# Patient Record
Sex: Male | Born: 1960 | Hispanic: No | Marital: Married | Smoking: Former smoker
Health system: Southern US, Community
[De-identification: ages and names within clinical notes are randomized; demographics above are authoritative.]

---

## 2013-09-13 ENCOUNTER — Encounter (HOSPITAL_COMMUNITY): Payer: Self-pay | Admitting: Emergency Medicine

## 2013-09-13 ENCOUNTER — Emergency Department (INDEPENDENT_AMBULATORY_CARE_PROVIDER_SITE_OTHER)
Admission: EM | Admit: 2013-09-13 | Discharge: 2013-09-13 | Disposition: A | Discharge status: Nursing Home (Includes ICF) | Payer: Self-pay | Source: Home / Self Care | Attending: Family Medicine | Admitting: Family Medicine

## 2013-09-13 ENCOUNTER — Emergency Department (INDEPENDENT_AMBULATORY_CARE_PROVIDER_SITE_OTHER): Payer: Self-pay

## 2013-09-13 ENCOUNTER — Emergency Department (HOSPITAL_COMMUNITY)
Admission: EM | Admit: 2013-09-13 | Discharge: 2013-09-13 | Disposition: A | Discharge status: Home / Self Care | Payer: Self-pay | Attending: Emergency Medicine | Admitting: Emergency Medicine

## 2013-09-13 DIAGNOSIS — R509 Fever, unspecified: Secondary | ICD-10-CM

## 2013-09-13 DIAGNOSIS — Z87891 Personal history of nicotine dependence: Secondary | ICD-10-CM | POA: Insufficient documentation

## 2013-09-13 LAB — BASIC METABOLIC PANEL
BUN: 18 mg/dL (ref 6–23)
CHLORIDE: 94 meq/L — AB (ref 96–112)
CO2: 25 mEq/L (ref 19–32)
Calcium: 9.3 mg/dL (ref 8.4–10.5)
Creatinine, Ser: 0.8 mg/dL (ref 0.50–1.35)
GFR calc non Af Amer: >90 mL/min (ref >90)
Glucose, Bld: 111 mg/dL — ABNORMAL HIGH (ref 70–99)
POTASSIUM: 4.4 meq/L (ref 3.7–5.3)
Sodium: 130 mEq/L — ABNORMAL LOW (ref 137–147)

## 2013-09-13 LAB — CBC WITH DIFFERENTIAL/PLATELET
BASOS PCT: 0 % (ref 0–1)
Basophils Absolute: 0 10*3/uL (ref 0.0–0.1)
Basophils Absolute: 0 10*3/uL (ref 0.0–0.1)
Basophils Relative: 0 % (ref 0–1)
EOS ABS: 0.4 10*3/uL (ref 0.0–0.7)
EOS ABS: 0.3 10*3/uL (ref 0.0–0.7)
Eosinophils Relative: 2 % (ref 0–5)
Eosinophils Relative: 2 % (ref 0–5)
HCT: 39.4 % (ref 39.0–52.0)
HCT: 39.5 % (ref 39.0–52.0)
Hemoglobin: 12.7 g/dL — ABNORMAL LOW (ref 13.0–17.0)
Hemoglobin: 13.1 g/dL (ref 13.0–17.0)
LYMPHS ABS: 1.8 10*3/uL (ref 0.7–4.0)
LYMPHS PCT: 9 % — AB (ref 12–46)
Lymphocytes Relative: 10 % — ABNORMAL LOW (ref 12–46)
Lymphs Abs: 2.1 10*3/uL (ref 0.7–4.0)
MCH: 27.5 pg (ref 26.0–34.0)
MCH: 28 pg (ref 26.0–34.0)
MCHC: 32.2 g/dL (ref 30.0–36.0)
MCHC: 33.2 g/dL (ref 30.0–36.0)
MCV: 85.3 fL (ref 78.0–100.0)
MCV: 84.4 fL (ref 78.0–100.0)
Monocytes Absolute: 1.5 10*3/uL — ABNORMAL HIGH (ref 0.1–1.0)
Monocytes Absolute: 1.3 10*3/uL — ABNORMAL HIGH (ref 0.1–1.0)
Monocytes Relative: 7 % (ref 3–12)
Monocytes Relative: 6 % (ref 3–12)
NEUTROS PCT: 82 % — AB (ref 43–77)
Neutro Abs: 17.3 10*3/uL — ABNORMAL HIGH (ref 1.7–7.7)
Neutro Abs: 16.9 10*3/uL — ABNORMAL HIGH (ref 1.7–7.7)
Neutrophils Relative %: 82 % — ABNORMAL HIGH (ref 43–77)
PLATELETS: 257 10*3/uL (ref 150–400)
PLATELETS: 257 10*3/uL (ref 150–400)
RBC: 4.62 MIL/uL (ref 4.22–5.81)
RBC: 4.68 MIL/uL (ref 4.22–5.81)
RDW: 14.3 % (ref 11.5–15.5)
RDW: 14.4 % (ref 11.5–15.5)
WBC: 21 10*3/uL — AB (ref 4.0–10.5)
WBC: 20.6 10*3/uL — ABNORMAL HIGH (ref 4.0–10.5)

## 2013-09-13 LAB — POCT URINALYSIS DIP (DEVICE)
GLUCOSE, UA: 100 mg/dL — AB
Ketones, ur: NEGATIVE mg/dL
Leukocytes, UA: NEGATIVE
NITRITE: NEGATIVE
Protein, ur: 100 mg/dL — AB
Specific Gravity, Urine: 1.015 (ref 1.005–1.030)
Urobilinogen, UA: >=8 mg/dL (ref 0.0–1.0)
pH: 6.5 (ref 5.0–8.0)

## 2013-09-13 LAB — URINALYSIS, ROUTINE W REFLEX MICROSCOPIC
Glucose, UA: NEGATIVE mg/dL
Hgb urine dipstick: NEGATIVE
Ketones, ur: 15 mg/dL — AB
NITRITE: NEGATIVE
PH: 6 (ref 5.0–8.0)
Protein, ur: NEGATIVE mg/dL
Specific Gravity, Urine: 1.021 (ref 1.005–1.030)
Urobilinogen, UA: 2 mg/dL — ABNORMAL HIGH (ref 0.0–1.0)

## 2013-09-13 LAB — HEPATIC FUNCTION PANEL
ALT: 18 U/L (ref 0–53)
AST: 26 U/L (ref 0–37)
Albumin: 2.8 g/dL — ABNORMAL LOW (ref 3.5–5.2)
Alkaline Phosphatase: 155 U/L — ABNORMAL HIGH (ref 39–117)
BILIRUBIN DIRECT: 0.3 mg/dL (ref 0.0–0.3)
BILIRUBIN INDIRECT: 0.6 mg/dL (ref 0.3–0.9)
TOTAL PROTEIN: 8.7 g/dL — AB (ref 6.0–8.3)
Total Bilirubin: 0.9 mg/dL (ref 0.3–1.2)

## 2013-09-13 LAB — I-STAT CG4 LACTIC ACID, ED: LACTIC ACID, VENOUS: 1.04 mmol/L (ref 0.5–2.2)

## 2013-09-13 LAB — URINE MICROSCOPIC-ADD ON

## 2013-09-13 LAB — LIPASE, BLOOD: Lipase: 21 U/L (ref 11–59)

## 2013-09-13 NOTE — ED Provider Notes (Addendum)
CSN: [id number]     Arrival date & time 09/13/13  [id number] History   First MD Initiated Contact with Patient 09/13/13 1007     Chief Complaint  Patient presents with  . Fever   (Consider location/radiation/quality/duration/timing/severity/associated sxs/prior Treatment) Patient is a 53 y.o. male presenting with fever. The history is provided by the patient.  Fever Max temp prior to arrival:  101 yest Temp source:  Oral Severity:  Moderate Onset quality:  Gradual Duration:  5 days Progression:  Waxing and waning Chronicity:  New Relieved by:  Ibuprofen Associated symptoms: cough   Associated symptoms: no chest pain, no congestion, no diarrhea, no dysuria, no nausea, no rash and no vomiting     History reviewed. No pertinent past medical history. History reviewed. No pertinent past surgical history. No family history on file. History  Substance Use Topics  . Smoking status: Not on file  . Smokeless tobacco: Not on file  . Alcohol Use: No    Review of Systems  Constitutional: Positive for fever.  HENT: Negative for congestion.   Respiratory: Positive for cough.   Cardiovascular: Negative for chest pain.  Gastrointestinal: Negative for nausea, vomiting and diarrhea.  Genitourinary: Negative for dysuria, urgency, frequency and hematuria.  Skin: Negative for rash.    Allergies  Review of patient's allergies indicates no known allergies.  Home Medications   Prior to Admission medications   Not on File   BP 148/91  Pulse 92  Temp(Src) 99.1 F (37.3 C) (Oral)  Resp 18  SpO2 98% Physical Exam  Nursing note and vitals reviewed. Constitutional: He is oriented to person, place, and time. He appears well-developed and well-nourished.  HENT:  Right Ear: External ear normal.  Left Ear: External ear normal.  Mouth/Throat: Oropharynx is clear and moist.  Eyes: Conjunctivae are normal. Pupils are equal, round, and reactive to light.  Neck: Normal range of motion. Neck supple.   Cardiovascular: Normal heart sounds and intact distal pulses.   Pulmonary/Chest: Effort normal and breath sounds normal.  Abdominal: Soft. Bowel sounds are normal. He exhibits no distension and no mass. There is no tenderness. There is no rebound and no guarding.  Lymphadenopathy:    He has no cervical adenopathy.  Neurological: He is alert and oriented to person, place, and time.  Skin: Skin is warm and dry.    ED Course  Procedures (including critical care time) Labs Review Labs Reviewed  CBC WITH DIFFERENTIAL - Abnormal; Notable for the following:    WBC 21.0 (*)    Hemoglobin 12.7 (*)    Neutrophils Relative % 82 (*)    Neutro Abs 17.3 (*)    Lymphocytes Relative 9 (*)    Monocytes Absolute 1.5 (*)    All other components within normal limits  POCT URINALYSIS DIP (DEVICE) - Abnormal; Notable for the following:    Glucose, UA 100 (*)    Bilirubin Urine MODERATE (*)    Hgb urine dipstick TRACE (*)    Protein, ur 100 (*)    All other components within normal limits   Cbc--wbc 21k left shift Imaging Review Dg Chest 2 View  09/13/2013   CLINICAL DATA:  Fever  EXAM: CHEST  2 VIEW  COMPARISON:  None.  FINDINGS: The heart size and mediastinal contours are within normal limits. Both lungs are clear. The visualized skeletal structures are unremarkable.  IMPRESSION: No active cardiopulmonary disease.   Electronically Signed   By: Alcide CleverMark  Lukens M.D.   On: 09/13/2013 11:29  X-rays reviewed and report per radiologist.   MDM   1. Fever of unknown origin (FUO)    Sent to ER for further eval of fever, increased wbc, no etiol.   Linna HoffJames D Oluwaseun Cremer, MD 09/13/13 1030  Linna HoffJames D Kenya Kook, MD 09/13/13 [phone]

## 2013-09-13 NOTE — ED Notes (Signed)
Pt presents with fever and chills x1 week, states he took Tylenol with some relief but fever would returned. 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 Pt also reports decrease in appetite for several months. Pt states for approx 5 months he has experienced a slight pain st the end of urinating, pt denies penile discharge, or a slow start when urinating,  abd pain, or flank pain. Pt reports he will increase his water intake and the pain to urination subsides.

## 2013-09-13 NOTE — ED Provider Notes (Signed)
CSN: [id number]     Arrival date & time 09/13/13  1210 History   First MD Initiated Contact with Patient 09/13/13 1401     Chief Complaint  Patient presents with  . Fever     (Consider location/radiation/quality/duration/timing/severity/associated sxs/prior Treatment) HPI Carl Mills is a 53 y.o. male emergency department complaining of fever. Patient states that he has felt feverish for about a week, states that he has been taking Tylenol and Motrin with good relief. 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 Today he decided to the urgent care to get checked. Patient denies any other associated symptoms. He denies any nasal congestion, sore throat, headache, visual changes, neck pain or stiffness, chest pain, cough, abdominal pain, back pain, nausea, vomiting, diarrhea, urinary symptoms. He denies any known tick or insect exposure. He denies being outside for prolonged amounts of time. He denies any rash. He denies history of the same. He denies any recent travel or surgeries. Dental pain. He was seen by urgent care, was found to have a white blood count of 21, with a left shift, sent here for further evaluation.  History reviewed. No pertinent past medical history. History reviewed. No pertinent past surgical history. History reviewed. No pertinent family history. History  Substance Use Topics  . Smoking status: Former Games developermoker  . Smokeless tobacco: Not on file  . Alcohol Use: No    Review of Systems  Constitutional: Positive for fever. Negative for chills and fatigue.  HENT: Negative for congestion, ear pain, sore throat and trouble swallowing.   Respiratory: Negative for cough, chest tightness and shortness of breath.   Cardiovascular: Negative for chest pain, palpitations and leg swelling.  Gastrointestinal: Negative for nausea, vomiting,  abdominal pain, diarrhea and abdominal distention.  Genitourinary: Negative for dysuria, urgency, frequency and hematuria.  Musculoskeletal: Negative for arthralgias, myalgias, neck pain and neck stiffness.  Skin: Negative for rash.  Allergic/Immunologic: Negative for immunocompromised state.  Neurological: Negative for dizziness, weakness, light-headedness, numbness and headaches.      Allergies  Review of patient's allergies indicates no known allergies.  Home Medications   Prior to Admission medications   Medication Sig Start Date End Date Taking? Authorizing Provider  acetaminophen (TYLENOL) 500 MG tablet Take 500 mg by mouth every 8 (eight) hours as needed for moderate pain.   Yes Historical Provider, MD  Multiple Vitamin (MULTIVITAMIN WITH MINERALS) TABS tablet Take 1 tablet by mouth daily. [employer] mega men   Yes Historical Provider, MD   BP 139/84  Pulse 94  Temp(Src) 99.1 F (37.3 C) (Oral)  Resp 18  Ht 5\' 9"  (1.753 m)  Wt 200 lb (90.719 kg)  BMI 29.52 kg/m2  SpO2 99% Physical Exam  Nursing note and vitals reviewed. Constitutional: He is oriented to person, place, and time. He appears well-developed and well-nourished. No distress.  HENT:  Head: Normocephalic and atraumatic.  Right Ear: External ear normal.  Left Ear: External ear normal.  Nose: Nose normal.  Mouth/Throat: Oropharynx is clear and moist.  Uvula midline  Eyes: Conjunctivae are normal.  Neck: Normal range of motion. Neck supple.  No meningismus  Cardiovascular: Normal rate, regular rhythm and normal heart sounds.   Pulmonary/Chest: Effort normal. No respiratory distress. He has no wheezes. He has no rales.  Abdominal: Soft. Bowel sounds are normal. He exhibits  no distension. There is no tenderness. There is no rebound.  Musculoskeletal: He exhibits no edema.  Neurological: He is alert and oriented to person, place, and time.  Skin: Skin is warm and dry.    ED Course  Procedures (including critical  care time) Labs Review Labs Reviewed  BASIC METABOLIC PANEL - Abnormal; Notable for the following:    Sodium 130 (*)    Chloride 94 (*)    Glucose, Bld 111 (*)    All other components within normal limits  URINALYSIS, ROUTINE W REFLEX MICROSCOPIC - Abnormal; Notable for the following:    Color, Urine AMBER (*)    Bilirubin Urine SMALL (*)    Ketones, ur 15 (*)    Urobilinogen, UA 2.0 (*)    Leukocytes, UA SMALL (*)    All other components within normal limits  CBC WITH DIFFERENTIAL - Abnormal; Notable for the following:    WBC 20.6 (*)    Neutrophils Relative % 82 (*)    Neutro Abs 16.9 (*)    Lymphocytes Relative 10 (*)    Monocytes Absolute 1.3 (*)    All other components within normal limits  HEPATIC FUNCTION PANEL - Abnormal; Notable for the following:    Total Protein 8.7 (*)    Albumin 2.8 (*)    Alkaline Phosphatase 155 (*)    All other components within normal limits  CULTURE, BLOOD (ROUTINE X 2)  CULTURE, BLOOD (ROUTINE X 2)  URINE CULTURE  URINE MICROSCOPIC-ADD ON  LIPASE, BLOOD  ROCKY MTN SPOTTED FVR AB, IGG-BLOOD  ROCKY MTN SPOTTED FVR AB, IGM-BLOOD  LYME DISEASE DNA BY PCR(BORRELIA BURG)  EHRLICHIA ANTIBODY PANEL  I-STAT CG4 LACTIC ACID, ED    Imaging Review Dg Chest 2 View  09/13/2013   CLINICAL DATA:  Fever  EXAM: CHEST  2 VIEW  COMPARISON:  None.  FINDINGS: The heart size and mediastinal contours are within normal limits. Both lungs are clear. The visualized skeletal structures are unremarkable.  IMPRESSION: No active cardiopulmonary disease.   Electronically Signed   By: Alcide CleverMark  Lukens M.D.   On: 09/13/2013 11:29     EKG Interpretation None      MDM   Final diagnoses:  Fever    Patient emergency department with fever, no other complaints. He is afebrile here and at the urgent care. Labs show a white count of 20. There is a left shift. All other labs are grossly unremarkable. I do not have a source of his fever. He does not have any signs of  pharyngitis or peritonsillar abscess. There is no meningismus. He does not have a headache. There is no abdominal pain or tenderness. No chest pain or shortness of breath. No rash. No lesions on the skin. His sodium is slightly low, consider tickborne illnesses. [facility] fever and Lyme titers ordered. Lactic acid is normal, doubt sepsis. Blood cultures are pending. His vital signs are normal here except for mild hypertension. At this time he does not appear to be toxic or acutely and on. Vital signs reassuring. Discussed with Dr. Effie ShyWentz who agreed, will discharge patient home with close followup with primary care doctor and return precautions given. Labs are all pending and he will be contacted if they return abnormal.   Filed Vitals:   09/13/13 1600 09/13/13 1645 09/13/13 1658 09/13/13 1659  BP: 135/72 139/74 139/74   Pulse: 90 96 97   Temp:    98.9 F (37.2 C)  TempSrc:    Oral  Resp:   20 19  Height:      Weight:      SpO2: 96% 97% 100%       Myriam Jacobson Geovani Tootle, PA-C 09/13/13 1707

## 2013-09-13 NOTE — Discharge Instructions (Signed)
Your tests here in emergency department showed elevated white blood count, otherwise no major abnormalities. We did get blood tests that take several days to come back. You will be called if your tests, back upper normal. For now make sure to take Tylenol and Motrin for your fever. Drink clear fluids. Rest. Make sure to followup with her primary care physician for recheck in 2-3 days.   Fever, Adult A fever is a higher than normal body temperature. In an adult, an oral temperature around 98.6 F (37 C) is considered normal. A temperature of 100.4 F (38 C) or higher is generally considered a fever. Mild or moderate fevers generally have no long-term effects and often do not require treatment. Extreme fever (greater than or equal to 106 F or 41.1 C) can cause seizures. The sweating that may occur with repeated or prolonged fever may cause dehydration. Elderly people can develop confusion during a fever. A measured temperature can vary with:  Age.  Time of day.  Method of measurement (mouth, underarm, rectal, or ear). The fever is confirmed by taking a temperature with a thermometer. Temperatures can be taken different ways. Some methods are accurate and some are not.  An oral temperature is used most commonly. Electronic thermometers are fast and accurate.  An ear temperature will only be accurate if the thermometer is positioned as recommended by the manufacturer.  A rectal temperature is accurate and done for those adults who have a condition where an oral temperature cannot be taken.  An underarm (axillary) temperature is not accurate and not recommended. Fever is a symptom, not a disease.  CAUSES   Infections commonly cause fever.  Some noninfectious causes for fever include:  Some arthritis conditions.  Some thyroid or adrenal gland conditions.  Some immune system conditions.  Some types of cancer.  A medicine reaction.  High doses of certain street drugs such as  methamphetamine.  Dehydration.  Exposure to high outside or room temperatures.  Occasionally, the source of a fever cannot be determined. This is sometimes called a "fever of unknown origin" (FUO).  Some situations may lead to a temporary rise in body temperature that may go away on its own. Examples are:  Childbirth.  Surgery.  Intense exercise. HOME CARE INSTRUCTIONS   Take appropriate medicines for fever. Follow dosing instructions carefully. If you use acetaminophen to reduce the fever, be careful to avoid taking other medicines that also contain acetaminophen. Do not take aspirin for a fever if you are younger than age 51. There is an association with Reye's syndrome. Reye's syndrome is a rare but potentially deadly disease.  If an infection is present and antibiotics have been prescribed, take them as directed. Finish them even if you start to feel better.  Rest as needed.  Maintain an adequate fluid intake. To prevent dehydration during an illness with prolonged or recurrent fever, you may need to drink extra fluid.Drink enough fluids to keep your urine clear or pale yellow.  Sponging or bathing with room temperature water may help reduce body temperature. Do not use ice water or alcohol sponge baths.  Dress comfortably, but do not over-bundle. SEEK MEDICAL CARE IF:   You are unable to keep fluids down.  You develop vomiting or diarrhea.  You are not feeling at least partly better after 3 days.  You develop new symptoms or problems. SEEK IMMEDIATE MEDICAL CARE IF:   You have shortness of breath or trouble breathing.  You develop excessive weakness.  You  are dizzy or you faint.  You are extremely thirsty or you are making little or no urine.  You develop new pain that was not there before (such as in the head, neck, chest, back, or abdomen).  You have persistant vomiting and diarrhea for more than 1 to 2 days.  You develop a stiff neck or your eyes become  sensitive to light.  You develop a skin rash.  You have a fever or persistent symptoms for more than 2 to 3 days.  You have a fever and your symptoms suddenly get worse. MAKE SURE YOU:   Understand these instructions.  Will watch your condition.  Will get help right away if you are not doing well or get worse. Document Released: 10/22/2000 Document Revised: 07/21/2011 Document Reviewed: 02/27/2011 [facility] Patient Information 2014 [place], [place].   Emergency Department Resource Guide 1) Find a Doctor and Pay Out of Pocket Although you won't have to find out who is covered by your insurance plan, it is a good idea to ask around and get recommendations. You will then need to call the office and see if the doctor you have chosen will accept you as a new patient and what types of options they offer for patients who are self-pay. Some doctors offer discounts or will set up payment plans for their patients who do not have insurance, but you will need to ask so you aren't surprised when you get to your appointment.  2) Contact Your Local Health Department Not all health departments have doctors that can see patients for sick visits, but many do, so it is worth a call to see if yours does. If you don't know where your local health department is, you can check in your phone book. The CDC also has a tool to help you locate your state's health department, and many state websites also have listings of all of their local health departments.  3) Find a Walk-in Clinic If your illness is not likely to be very severe or complicated, you may want to try a walk in clinic. These are popping up all over the country in pharmacies, drugstores, and shopping centers. They're usually staffed by nurse practitioners or physician assistants that have been trained to treat common illnesses and complaints. They're usually fairly quick and inexpensive. However, if you have serious medical issues or chronic medical  problems, these are probably not your best option.  No Primary Care Doctor: - Call Health Connect at  [phone] - they can help you locate a primary care doctor that  accepts your insurance, provides certain services, etc. - Physician Referral Service- [phone]  Chronic Pain Problems: Organization         Address  Phone   Notes  [facility]  [phone] Patients need to be referred by their primary care doctor.   Medication Assistance: Organization         Address  Phone   Notes  [facility] Medication [facility] [address] [place] [place], [place] [address] [phone] --Must be a resident of [facility] -- Must have NO insurance coverage whatsoever (no Medicaid/ Medicare, etc.) -- The pt. MUST have a primary care doctor that directs their care regularly and follows them in the community   MedAssist  [phone]   [employer]  [phone]    Agencies that provide inexpensive medical care: Organization         Address  Phone   Notes  [facility]  [phone]   [facility]    [phone]   [facility] [address] [place], [place] [phone]   [facility] [address], [place] [phone]   Planned Parenthood    [phone]   [facility]    [phone]   [facility]  [address], [place] Phone:  [phone], Fax:  [phone] Hours of Operation:  9 am - 6 pm, M-F.  Also accepts Medicaid/Medicare and self-pay.  [facility]  [address], [place] Phone: [phone], Fax: [phone]) [phone]. Hours of Operation:  8:30 am - 5:30 pm, M-F.  Also accepts Medicaid and self-pay.  [facility] [place] [address], [place] Phone: [phone]) [phone]   [facility] [address], [place], [place] [phone], Ext. [phone]  Mondays & Thursdays: 7-9 AM.  First 15 patients are seen on a first come, first serve basis.    Medicaid-accepting [facility] Providers:  Organization         Address  Phone   Notes  [facility] [address], [place] [phone]) [phone] Also accepts self-pay patients.  [facility] [address], [place]  [phone]) [phone]   [facility] [address], [place] [phone]   [facility] [address] [place] [address], [place] [phone]) [phone]   Renaye RakersVeita Bland [address], [place]   [phone]) [phone] Only accepts [place] Access [place] patients after they have their name applied to their card.   Self-Pay (no insurance) in [facility]:  Organization         Address  Phone   Notes  Sickle Cell Patients, [facility] [address], [place] [phone]   [facility] [address], [place] [phone]   [facility] [place]  [address], [place] [phone]   [facility]/Dr. Osei-Bonsu  [address] [place] [address], [place] or [phone], [place] [phone] Phone number for both [place] and [place] locations is the same.  [facility] [address], [place] [phone]) [phone]   [facility] [address] [place] [address], [place] or [address] [phone]) [phone]   [facility] [address], [place] [phone], phone; [phone]) [phone], fax Sees patients 1st and 3rd Saturday of every month.  Must not qualify for public or private insurance (i.e. Medicaid, Medicare, [place] Health Choice, Veterans' Benefits)  Household income should be no more than 200% of the poverty level The clinic cannot treat you if you are pregnant or think you are pregnant  Sexually transmitted diseases are not treated at  the clinic.    Dental Care: Organization         Address  Phone  Notes  [facility] of [facility] [address], [place] [phone] Accepts children up to age 51 who are enrolled in [place] or [place] Health Choice; pregnant women with a Medicaid card; and children who have applied for Medicaid or [place] Health Choice, but were declined, whose parents can pay a reduced fee at time of service.  [facility] of  [facility]  [address], [place] [phone] Accepts children up to age 58 who are enrolled in Medicaid or [place]; pregnant women with a Medicaid card; and children who have applied for Medicaid or [place] Health Choice, but were declined, whose parents can pay a reduced fee at time of service.  [facility] Adult [facility] Access PROGRAM  [place] [phone] Patients are seen by appointment only. Walk-ins are not accepted. [place] will see patients 76 years of age and older. Monday - Tuesday (8am-5pm) Most Wednesdays (8:30-5pm) $30 per visit, cash only  [facility] [place] Adult Dental Access PROGRAM  [address], [facility] [phone] Patients are seen by appointment only. Walk-ins are not accepted. [place] will see patients 57 years of age and older. One Wednesday Evening (Monthly: Volunteer Based).  $30 per visit, cash only  [place]  [phone] for adults; Children under age 82, call Graduate Pediatric Dentistry at [phone]. Children aged 67-14, please call [phone] to request a pediatric application.  Dental services are provided in all areas of dental care including fillings, crowns and bridges, complete and partial dentures, implants, gum treatment, root canals, and extractions. Preventive care is also provided. Treatment is provided to both adults and children. Patients are selected via a lottery and there is often a  waiting list.   [facility] [address] [place]  [phone] [email or web address]   [facility] [address] [place], [place] [phone], Ext. [phone] Second and Fourth Thursday of each month, opens at 6:30 AM; Clinic ends at 9 AM.  Patients are seen on a first-come first-served basis, and a limited number are seen during each clinic.   [facility]  [address] Hillard Danker [place], [place] [phone]   Eligibility Requirements You must have lived in [place], [place], or [place] counties for at least the last three months.   You cannot be eligible for state or federal sponsored [employer], including [employer], [place], or [employer].   You generally cannot be eligible for healthcare insurance through your employer.    How to apply: Eligibility screenings are held every Tuesday and Wednesday afternoon from 1:00 pm until 4:00 pm. You do not need an appointment for the interview!  [facility] [address] [place], [place]   [place]  [place] Department  [place]  [phone]    Behavioral Health Resources in the Community: Intensive Outpatient Programs Organization         Address  Phone  Notes  [place]. [address] [place], [place] [phone]   [facility]patient [address] [place], [place]   ADS: Alcohol & Drug Svcs [address] [place], [place]   [place] [address],  [place], [place] or [phone]   Substance Abuse Resources Organization         Address  Phone  Notes  Alcohol and Drug Services  (607)[phone]   [place]  ([phone]   The [place]   Chinita Pester  (614) [phone]   Residential & Outpatient Substance Abuse  Program  [phone]   Psychological Services Organization         Address  Phone  Notes  [place]  [place]   [place] [address] [place] or 971-014-1942    Mobile Crisis Teams Organization         Address  Phone  Notes  [facility], Mobile Crisis Care Unit  ([phone])[phone]   [facility]otherapeutic [facility]  [address] [place], [place]   [place] Levels [address] [place] [phone]    Self-Help/Support Groups Organization         Address  Phone             Notes  [place]. of [place] - variety of support groups  [place] Call for more information  Narcotics Anonymous (NA), [facility] [address], [employer] [place]  2 meetings at this location   Special educational needs teacher         Address  Phone  Notes  ASAP Residential Treatment [place],    [place]  1-3316506608   [facility]  [address] [place] 291916, [place], [place]   [place], [place] [phone] Admissions: 8am-3pm M-F  Incentives Substance [place] [address],    [place], [place] [phone]   [facility] [address] [place], [place], [place]   [facility] [address],  [place], [place]   Insight Programs - Intensive Outpatient [address] Kristeen [address], [place], [place]   [facility] ([place].) [place].,  [place], [place] 1-9894775006 or [phone]   [facility] (RTS) [address] [place], [place] Accepts Medicaid  [facility] [place] [address],  [place] 1-854-456-0630 Substance Abuse/Addiction Treatment   [facility] Organization          Address  Phone  Notes  [facility]  [phone]   Domenic Schwab, PhD [address] Arlis Porta [place], [place]   [phone] or [phone]   [place], [place] [phone]   [facility] [address] [place], [place] [phone] Insurance/Medicaid/sponsorship through [facility] and Families [address] [place]                                    [place], [place] [phone] [place] [address], [place] [phone]    Dr. Adele Schilder  [phone]   [facility] Dept. 1) [address] [place] 2) [place] 3)  [place] [address], [place] [phone]  [phone]   [place] [phone] or [phone] (After Hours)

## 2013-09-13 NOTE — ED Notes (Signed)
Pt unable to provide an urine specimen at this time

## 2013-09-13 NOTE — ED Notes (Signed)
Pt sent from urgent care with abnormal labs-- denies any nausea/vomiting/diarrhea/ cough/ neck pain

## 2013-09-13 NOTE — ED Notes (Signed)
Reports fever for 1 week.  Denies any other symptoms.

## 2013-09-14 LAB — URINE CULTURE
Colony Count: NO GROWTH
Culture: NO GROWTH

## 2013-09-14 LAB — ROCKY MTN SPOTTED FVR AB, IGM-BLOOD: RMSF IgM: 0.34 IV (ref 0.00–0.89)

## 2013-09-14 LAB — ROCKY MTN SPOTTED FVR AB, IGG-BLOOD: RMSF IgG: 0.12 IV

## 2013-09-14 NOTE — ED Provider Notes (Signed)
Medical screening examination/treatment/procedure(s) were performed by non-physician practitioner and as supervising physician I was immediately available for consultation/collaboration.  Flint MelterElliott L Romeka Scifres, MD 09/14/13 1538

## 2013-09-15 LAB — EHRLICHIA ANTIBODY PANEL: E chaffeensis (HGE) Ab, IgG: <1:64 {titer}

## 2013-09-19 LAB — CULTURE, BLOOD (ROUTINE X 2)
Culture: NO GROWTH
Culture: NO GROWTH

## 2014-03-12 DEATH — deceased

## 2014-09-14 IMAGING — CR DG CHEST 2V
2 series · 2 of 2 positions shown · non-contrast
Comparison: None.

CLINICAL DATA: Fever

EXAM:
CHEST  2 VIEW

[view not recorded (1 of 2)]
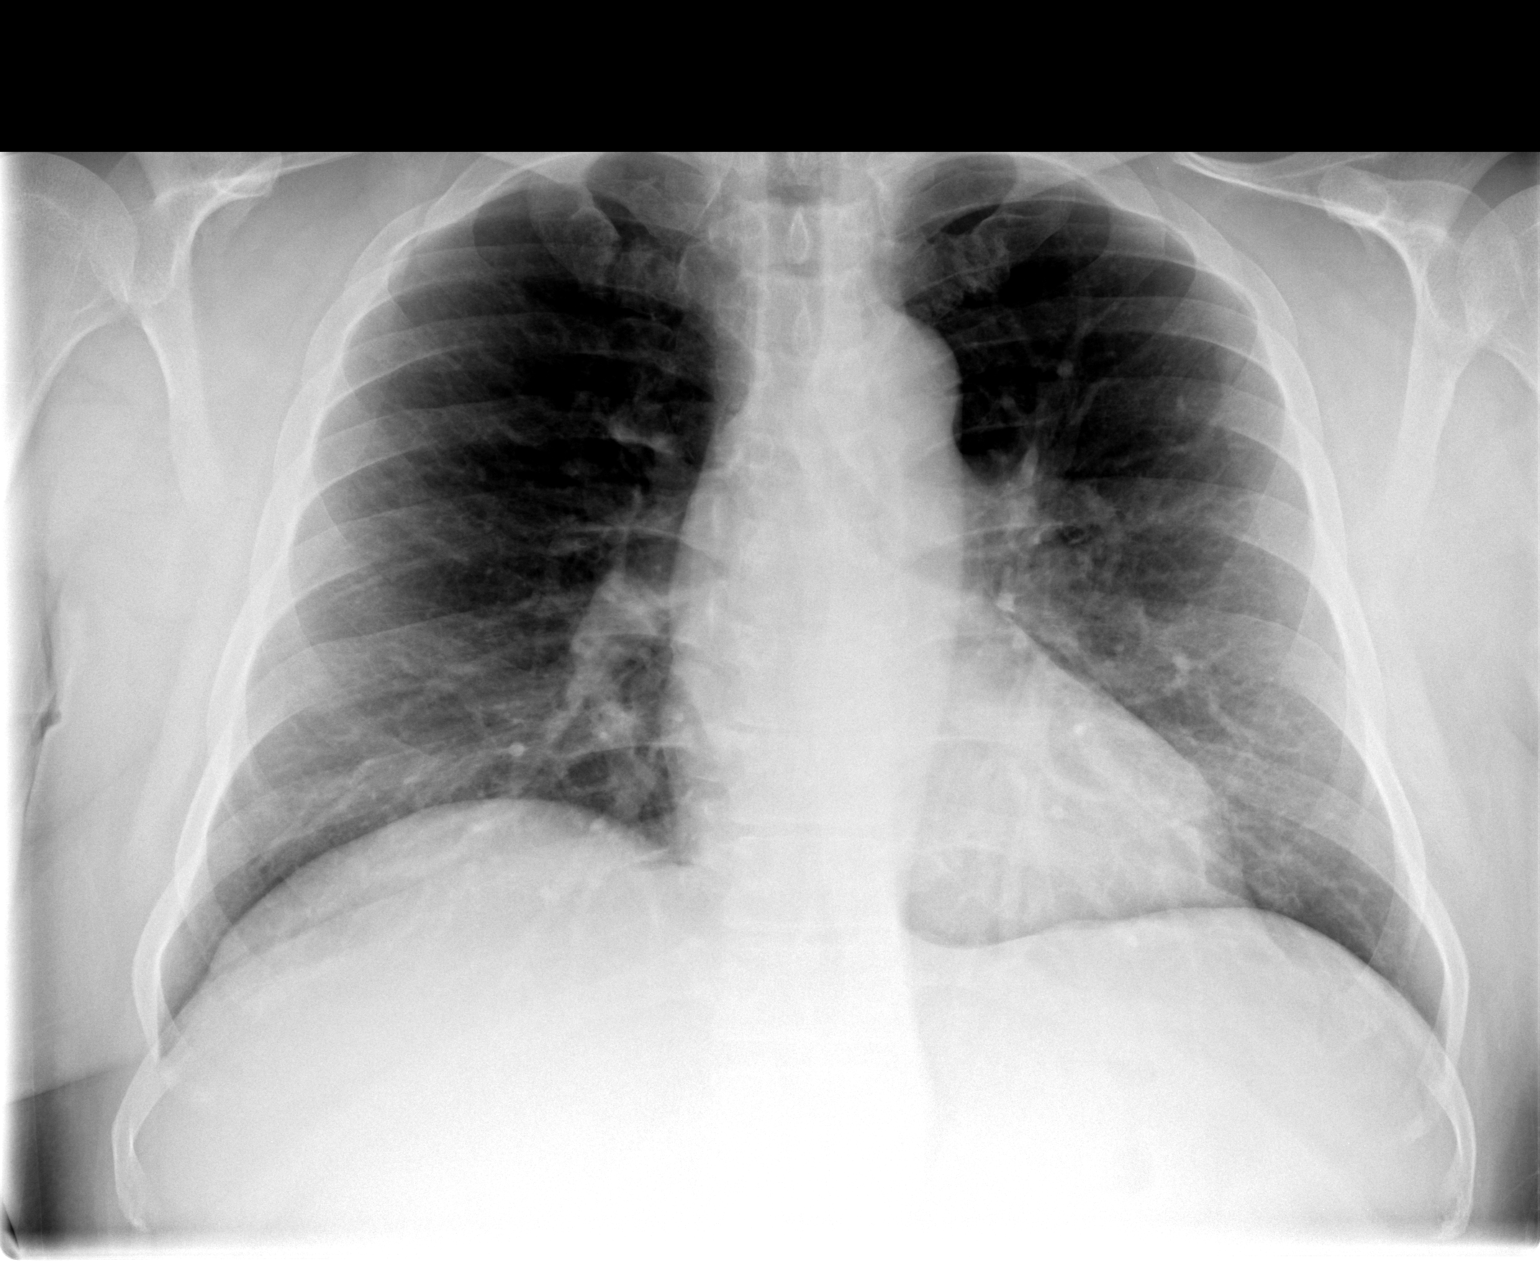

[view not recorded (2 of 2)]
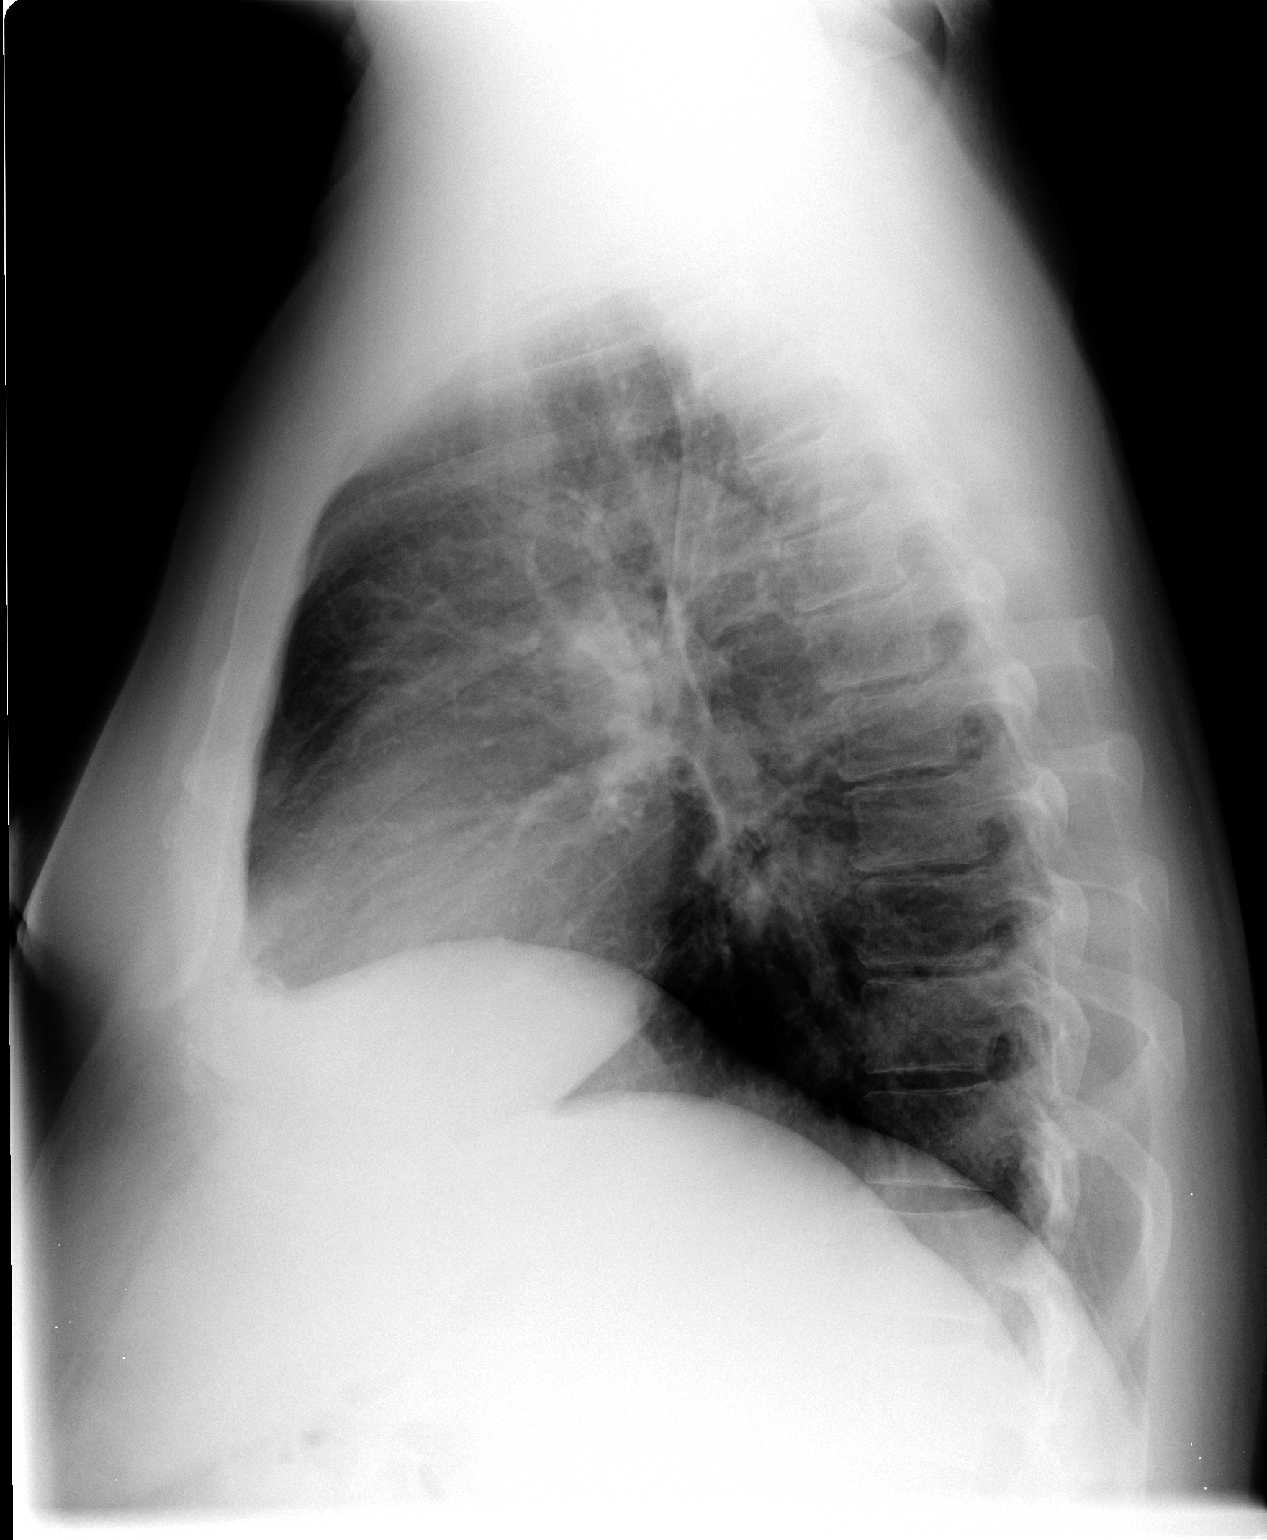

[2 of 2 positions shown; findings below may reference images not displayed]

FINDINGS: The heart size and mediastinal contours are within normal limits.
Both lungs are clear. The visualized skeletal structures are
unremarkable.
IMPRESSION: No active cardiopulmonary disease.
